# Patient Record
Sex: Female | Born: 1945 | Race: White | Hispanic: No | Marital: Married | Smoking: Never smoker
Health system: Southern US, Community
[De-identification: ages and names within clinical notes are randomized; demographics above are authoritative.]

## PROBLEM LIST (undated history)

## (undated) DIAGNOSIS — E785 Hyperlipidemia, unspecified: Secondary | ICD-10-CM

## (undated) DIAGNOSIS — I10 Essential (primary) hypertension: Secondary | ICD-10-CM

## (undated) DIAGNOSIS — I509 Heart failure, unspecified: Secondary | ICD-10-CM

## (undated) DIAGNOSIS — N189 Chronic kidney disease, unspecified: Secondary | ICD-10-CM

## (undated) DIAGNOSIS — E119 Type 2 diabetes mellitus without complications: Secondary | ICD-10-CM

## (undated) HISTORY — PX: CORONARY STENT PLACEMENT: SHX1402

## (undated) HISTORY — PX: OVARIAN CYST REMOVAL: SHX89

---

## 2012-07-04 ENCOUNTER — Encounter: Payer: Self-pay | Admitting: *Deleted

## 2012-07-04 ENCOUNTER — Emergency Department (INDEPENDENT_AMBULATORY_CARE_PROVIDER_SITE_OTHER)
Admission: EM | Admit: 2012-07-04 | Discharge: 2012-07-04 | Disposition: A | Discharge status: Home / Self Care | Payer: Medicare Other | Source: Home / Self Care | Attending: Family Medicine | Admitting: Family Medicine

## 2012-07-04 DIAGNOSIS — I509 Heart failure, unspecified: Secondary | ICD-10-CM

## 2012-07-04 DIAGNOSIS — J069 Acute upper respiratory infection, unspecified: Secondary | ICD-10-CM

## 2012-07-04 DIAGNOSIS — R300 Dysuria: Secondary | ICD-10-CM

## 2012-07-04 DIAGNOSIS — N3000 Acute cystitis without hematuria: Secondary | ICD-10-CM

## 2012-07-04 HISTORY — DX: Essential (primary) hypertension: I10

## 2012-07-04 HISTORY — DX: Type 2 diabetes mellitus without complications: E11.9

## 2012-07-04 HISTORY — DX: Hyperlipidemia, unspecified: E78.5

## 2012-07-04 HISTORY — DX: Heart failure, unspecified: I50.9

## 2012-07-04 LAB — POCT URINALYSIS DIP (MANUAL ENTRY)
Glucose, UA: NEGATIVE
Nitrite, UA: NEGATIVE
Protein Ur, POC: NEGATIVE
Urobilinogen, UA: 0.2 (ref 0–1)

## 2012-07-04 MED ORDER — CEPHALEXIN 500 MG PO CAPS: 500.0000 mg | ORAL_CAPSULE | Freq: Three times a day (TID) | ORAL | Status: DC

## 2012-07-04 MED ORDER — BENZONATATE 100 MG PO CAPS: ORAL_CAPSULE | ORAL | Status: DC

## 2012-07-04 NOTE — ED Provider Notes (Signed)
History     CSN: [id number]  Arrival date & time 07/04/12  1191   First MD Initiated Contact with Patient 07/04/12 [phone]      Chief Complaint  Patient presents with  . Dysuria  . Cough     HPI Comments: Patient complains of 2 day history of gradually progressive URI symptoms including sinus congestion and chest congestion.  She has had a mild cough.  Complains of fatigue but no myalgias.  Cough is generally non-productive during the day.  There has been no pleuritic pain but she occasionally wheezes and has shortness of breath.  She has a history of CHF, recently started on Coreg.  No PND. She also complains of 2 day history of dysuria, hesitancy, urgency but no back ache or fever.  She reports that she was treated for a UTI with Cipro about 3 weeks ago (with resolution of symptoms at that time).  The history is provided by the patient.    Past Medical History  Diagnosis Date  . Hypertension   . Diabetes mellitus without complication   . CHF (congestive heart failure)   . Hyperlipidemia     Past Surgical History  Procedure Date  . Coronary stent placement   . Ovarian cyst removal     Family History  Problem Relation Age of Onset  . Diabetes Sister   . Hyperlipidemia Sister   . Hypertension Sister   . COPD Brother     History  Substance Use Topics  . Smoking status: Never Smoker   . Smokeless tobacco: Not on file  . Alcohol Use: No    OB History    Grav Para Term Preterm Abortions TAB SAB Ect Mult Living                  Review of Systems No sore throat + cough No pleuritic pain + wheezing + nasal congestion + post-nasal drainage No sinus pain/pressure No itchy/red eyes No earache No hemoptysis + SOB with activity No fever/chills No nausea No vomiting No abdominal pain No diarrhea + dysuria/hesitancy/urgency No skin rashes + fatigue No myalgias + headache Used OTC meds without relief  Allergies  Sulfur  Home Medications   Current  Outpatient Rx  Name Route Sig Dispense Refill  . CARVEDILOL 25 MG PO TABS Oral Take 25 mg by mouth 2 (two) times daily with a meal.    . FERROUS SULFATE DRIED PO Oral Take by mouth.    . FUROSEMIDE 20 MG PO TABS Oral Take 20 mg by mouth 2 (two) times daily.    Marland Kitchen METFORMIN HCL 500 MG PO TABS Oral Take 500 mg by mouth 2 (two) times daily with a meal.    . NIACIN 100 MG PO TABS Oral Take 100 mg by mouth daily with breakfast.    . OMEPRAZOLE 40 MG PO CPDR Oral Take 40 mg by mouth daily.    Marland Kitchen PRAVASTATIN SODIUM 20 MG PO TABS Oral Take 20 mg by mouth daily.    Marland Kitchen BENZONATATE 100 MG PO CAPS  Take one cap at bedtime as necessary for cough 12 capsule 0  . CEPHALEXIN 500 MG PO CAPS Oral Take 1 capsule (500 mg total) by mouth 3 (three) times daily. 21 capsule 0    BP 148/86  Pulse 89  Temp 98.1 F (36.7 C) (Oral)  Resp 14  Ht 5\' 6"  (1.676 m)  Wt 183 lb (83.008 kg)  BMI 29.54 kg/m2  SpO2 96%  Physical Exam Nursing  notes and Vital Signs reviewed. Appearance:  Patient appears healthy, stated age, and in no acute distress Eyes:  Pupils are equal, round, and reactive to light and accomodation.  Extraocular movement is intact.  Conjunctivae are not inflamed  Ears:  Canals normal.  Tympanic membranes normal.  Nose:  Mildly congested turbinates.  No sinus tenderness.    Pharynx:  Normal Neck:  Supple.  Tender shotty posterior nodes are palpated, primarily on left Lungs:   Few faint scattered wheezes; no rales.  Breath sounds are equal.  Heart:  Regular rate and rhythm without murmurs, rubs, or gallops.  Abdomen:  Nontender without masses or hepatosplenomegaly.  Bowel sounds are present.  No CVA or flank tenderness.  Extremities:  No edema.  No calf tenderness Skin:  No rash present.   ED Course  Procedures  none   Labs Reviewed  POCT URINALYSIS DIP (MANUAL ENTRY) small leuks, otherwise negative  URINE CULTURE pending      1. Dysuria   2. Acute cystitis   3. Acute upper respiratory  infections of unspecified site; suspect early viral URI   4.  CHF appears stable    MDM  Urine culture pending.  Begin Keflex to cover UTI, and hopefully some additional respiratory coverage as her URI develops.  Patient increased risk with her CHF If she continues to have recurring cystitis, recommend she followup with PCP  Take plain Mucinex (guaifenesin) twice daily for cough and congestion.  Increase fluid intake, rest. May use Afrin nasal spray (or generic oxymetazoline) twice daily for about 5 days.  Also recommend using saline nasal spray several times daily and saline nasal irrigation (AYR is a common brand) Stop all antihistamines for now, and other non-prescription cough/cold preparations. Follow-up with family doctor if not improving 7 to 10 days.        Lattie Haw, MD 07/04/12 [phone]

## 2012-07-04 NOTE — ED Notes (Signed)
Patient c/o dysuria and urinary frequency x last night. Denies hematuria. Recent UTI 1 month ago. Also reports cough and congestion x 2 days. No fever. No OTC meds taken.

## 2012-07-06 ENCOUNTER — Telehealth: Payer: Self-pay

## 2012-07-06 LAB — URINE CULTURE: Colony Count: 10000

## 2012-07-06 NOTE — ED Notes (Signed)
Left message for patient to return call. Urine Culture

## 2012-10-15 ENCOUNTER — Telehealth: Payer: Self-pay | Admitting: *Deleted

## 2012-10-15 ENCOUNTER — Encounter: Payer: Self-pay | Admitting: *Deleted

## 2012-10-15 ENCOUNTER — Emergency Department (INDEPENDENT_AMBULATORY_CARE_PROVIDER_SITE_OTHER)
Admission: EM | Admit: 2012-10-15 | Discharge: 2012-10-15 | Disposition: A | Discharge status: Home / Self Care | Payer: Medicare Other | Source: Home / Self Care | Attending: Family Medicine | Admitting: Family Medicine

## 2012-10-15 DIAGNOSIS — N3000 Acute cystitis without hematuria: Secondary | ICD-10-CM

## 2012-10-15 DIAGNOSIS — R300 Dysuria: Secondary | ICD-10-CM

## 2012-10-15 LAB — POCT URINALYSIS DIP (MANUAL ENTRY)
Bilirubin, UA: NEGATIVE
Blood, UA: NEGATIVE
Ketones, POC UA: NEGATIVE
Spec Grav, UA: 1.015 (ref 1.005–1.03)
pH, UA: 6 (ref 5–8)

## 2012-10-15 MED ORDER — CIPROFLOXACIN HCL 250 MG PO TABS: 250.0000 mg | ORAL_TABLET | Freq: Two times a day (BID) | ORAL | Status: DC

## 2012-10-15 NOTE — ED Notes (Signed)
Patient c/o dysuria x yesterday. Denies fever or chills.

## 2012-10-15 NOTE — ED Provider Notes (Addendum)
History     CSN: [id number]  Arrival date & time 10/15/12  1447   First MD Initiated Contact with Patient 10/15/12 1517      Chief Complaint  Patient presents with  . Dysuria       HPI Comments: Patient complains of developing lower abdominal pressure, dysuria, and hesitancy last night.  Patient is a 67 y.o. female presenting with dysuria. The history is provided by the patient.  Dysuria  This is a new problem. The current episode started yesterday. The problem occurs every urination. Associated symptoms include frequency, hesitancy and urgency. Pertinent negatives include no chills, no sweats, no nausea, no vomiting, no discharge, no hematuria and no flank pain. She has tried nothing for the symptoms.    Past Medical History  Diagnosis Date  . Hypertension   . Diabetes mellitus without complication   . CHF (congestive heart failure)   . Hyperlipidemia     Past Surgical History  Procedure Laterality Date  . Coronary stent placement    . Ovarian cyst removal      Family History  Problem Relation Age of Onset  . Diabetes Sister   . Hyperlipidemia Sister   . Hypertension Sister   . COPD Brother     History  Substance Use Topics  . Smoking status: Never Smoker   . Smokeless tobacco: Not on file  . Alcohol Use: No    OB History   Grav Para Term Preterm Abortions TAB SAB Ect Mult Living                  Review of Systems  Constitutional: Negative for chills.  Gastrointestinal: Negative for nausea and vomiting.  Genitourinary: Positive for dysuria, hesitancy, urgency and frequency. Negative for hematuria and flank pain.  All other systems reviewed and are negative.    Allergies  Latex and Sulfur  Home Medications   Current Outpatient Rx  Name  Route  Sig  Dispense  Refill  . carvedilol (COREG) 25 MG tablet   Oral   Take 25 mg by mouth 2 (two) times daily with a meal.         . ciprofloxacin (CIPRO) 250 MG tablet   Oral   Take 1 tablet (250 mg  total) by mouth 2 (two) times daily.   14 tablet   0   . FERROUS SULFATE DRIED PO   Oral   Take by mouth.         . furosemide (LASIX) 20 MG tablet   Oral   Take 20 mg by mouth 2 (two) times daily.         . metFORMIN (GLUCOPHAGE) 500 MG tablet   Oral   Take 500 mg by mouth 2 (two) times daily with a meal.         . niacin 100 MG tablet   Oral   Take 100 mg by mouth daily with breakfast.         . omeprazole (PRILOSEC) 40 MG capsule   Oral   Take 40 mg by mouth daily.         . pravastatin (PRAVACHOL) 20 MG tablet   Oral   Take 20 mg by mouth daily.           BP 105/68  Pulse 96  Temp(Src) 98.2 F (36.8 C) (Oral)  Ht 5\' 6"  (1.676 m)  Wt 180 lb (81.647 kg)  BMI 29.07 kg/m2  SpO2 97%  Physical Exam Nursing notes and Vital Signs  reviewed. Appearance:  Patient appears healthy, stated age, and in no acute distress Eyes:  Pupils are equal, round, and reactive to light and accomodation.  Extraocular movement is intact.  Conjunctivae are not inflamed  Pharynx:  Normal Neck:  Supple.  No adenopathy Lungs:  Clear to auscultation.  Breath sounds are equal.  Heart:  Regular rate and rhythm without murmurs, rubs, or gallops.  Abdomen:  Nontender without masses or hepatosplenomegaly.  Bowel sounds are present.  No CVA or flank tenderness.  Extremities:  No edema.  No calf tenderness Skin:  No rash present.   ED Course  Procedures  none  Labs Reviewed  URINE CULTURE pending  POCT URINALYSIS DIP (MANUAL ENTRY) NIT positive; LEU small, otherwise negative      1. Dysuria   2. Acute cystitis       MDM   Urine culture pending Begin Cipro. Continue increased fluid intake.  May take AZO for about two days if desired. Followup with Family Doctor if not improved in one week.         Lattie Haw, MD 10/19/12 [phone]  Addendum:   Followup call to patient:  She reports that her urinary symptoms have completely resolved.  Lattie Haw,  MD 10/19/12 [phone]

## 2012-10-17 LAB — URINE CULTURE

## 2012-10-19 ENCOUNTER — Telehealth: Payer: Self-pay | Admitting: *Deleted

## 2014-04-11 ENCOUNTER — Emergency Department
Admission: EM | Admit: 2014-04-11 | Discharge: 2014-04-11 | Disposition: A | Discharge status: Home / Self Care | Payer: 59 | Source: Home / Self Care | Attending: Family Medicine | Admitting: Family Medicine

## 2014-04-11 ENCOUNTER — Encounter: Payer: Self-pay | Admitting: Emergency Medicine

## 2014-04-11 DIAGNOSIS — N3000 Acute cystitis without hematuria: Secondary | ICD-10-CM

## 2014-04-11 LAB — POCT URINALYSIS DIP (MANUAL ENTRY)
BILIRUBIN UA: NEGATIVE
Bilirubin, UA: NEGATIVE
Glucose, UA: NEGATIVE
Nitrite, UA: POSITIVE
PROTEIN UA: NEGATIVE
SPEC GRAV UA: 1.015 (ref 1.005–1.03)
UROBILINOGEN UA: 0.2 (ref 0–1)
pH, UA: 6 (ref 5–8)

## 2014-04-11 MED ORDER — CEPHALEXIN 500 MG PO CAPS: 500.0000 mg | ORAL_CAPSULE | Freq: Two times a day (BID) | ORAL | Status: DC

## 2014-04-11 NOTE — ED Notes (Signed)
Reports 2 days of urinary frequency with some lower back discomfort.

## 2014-04-11 NOTE — ED Provider Notes (Signed)
CSN: [id number]     Arrival date & time 04/11/14  1612 History   First MD Initiated Contact with Patient 04/11/14 1700     Chief Complaint  Patient presents with  . Urinary Frequency  . Back Pain      HPI Comments: Patient developed urinary frequency without dysuria one week ago.  Yesterday she developed chills and low back ache.  She has also been fatigued.  She denies any recent antibiotic use.  Patient is a 68 y.o. female presenting with frequency. The history is provided by the patient.  Urinary Frequency This is a new problem. Episode onset: 1 week ago. The problem occurs constantly. The problem has been gradually worsening. Pertinent negatives include no abdominal pain. Nothing aggravates the symptoms. Nothing relieves the symptoms. She has tried nothing for the symptoms.    Past Medical History  Diagnosis Date  . Hypertension   . Diabetes mellitus without complication   . CHF (congestive heart failure)   . Hyperlipidemia    Past Surgical History  Procedure Laterality Date  . Coronary stent placement    . Ovarian cyst removal     Family History  Problem Relation Age of Onset  . Diabetes Sister   . Hyperlipidemia Sister   . Hypertension Sister   . COPD Brother    History  Substance Use Topics  . Smoking status: Never Smoker   . Smokeless tobacco: Not on file  . Alcohol Use: No   OB History   Grav Para Term Preterm Abortions TAB SAB Ect Mult Living                 Review of Systems  Gastrointestinal: Negative for abdominal pain.  Genitourinary: Positive for frequency.  All other systems reviewed and are negative.   Allergies  Latex and Sulfur  Home Medications   Prior to Admission medications   Medication Sig Start Date End Date Taking? Authorizing Provider  carvedilol (COREG) 25 MG tablet Take 25 mg by mouth 2 (two) times daily with a meal.    Historical Provider, MD  cephALEXin (KEFLEX) 500 MG capsule Take 1 capsule (500 mg total) by mouth 2 (two) times  daily. 04/11/14   Lattie HawStephen A Beese, MD  ciprofloxacin (CIPRO) 250 MG tablet Take 1 tablet (250 mg total) by mouth 2 (two) times daily. 10/15/12   Lattie HawStephen A Beese, MD  FERROUS SULFATE DRIED PO Take by mouth.    Historical Provider, MD  furosemide (LASIX) 20 MG tablet Take 20 mg by mouth 2 (two) times daily.    Historical Provider, MD  metFORMIN (GLUCOPHAGE) 500 MG tablet Take 500 mg by mouth 2 (two) times daily with a meal.    Historical Provider, MD  niacin 100 MG tablet Take 100 mg by mouth daily with breakfast.    Historical Provider, MD  omeprazole (PRILOSEC) 40 MG capsule Take 40 mg by mouth daily.    Historical Provider, MD  pravastatin (PRAVACHOL) 20 MG tablet Take 20 mg by mouth daily.    Historical Provider, MD   BP 111/72  Pulse 89  Temp(Src) 98.4 F (36.9 C) (Oral)  Ht 5\' 6"  (1.676 m)  Wt 169 lb (76.658 kg)  BMI 27.29 kg/m2  SpO2 97% Physical Exam Nursing notes and Vital Signs reviewed. Appearance:  Patient appears healthy, stated age, and in no acute distress Eyes:  Pupils are equal, round, and reactive to light and accomodation.  Extraocular movement is intact.  Conjunctivae are not inflamed  Pharynx:  Normal;  moist mucous membranes  Neck:  Supple.   No adenopathy Lungs:  Clear to auscultation.  Breath sounds are equal.  Heart:  Regular rate and rhythm without murmurs, rubs, or gallops.  Abdomen:  Nontender without masses or hepatosplenomegaly.  Bowel sounds are present.  No CVA or flank tenderness.  Extremities:  No edema.  No calf tenderness Skin:  No rash present.   ED Course  Procedures  none    Labs Reviewed  URINE CULTURE  POCT URINALYSIS DIP (MANUAL ENTRY):  BLO trace intact; NIT positive; LEU moderate         MDM   1. Acute cystitis without hematuria    Urine culture pending.  Will empirically begin Keflex based on previous culture done 10/15/12. Increase fluid intake. If symptoms become significantly worse during the night or over the weekend, proceed to  the local emergency room.  Followup with Family Doctor if not improved in one week.     Lattie Haw, MD 04/15/14 [phone]

## 2014-04-11 NOTE — Discharge Instructions (Signed)
Increase fluid intake. °If symptoms become significantly worse during the night or over the weekend, proceed to the local emergency room.  ° ° °Urinary Tract Infection °Urinary tract infections (UTIs) can develop anywhere along your urinary tract. Your urinary tract is your body's drainage system for removing wastes and extra water. Your urinary tract includes two kidneys, two ureters, a bladder, and a urethra. Your kidneys are a pair of bean-shaped organs. Each kidney is about the size of your fist. They are located below your ribs, one on each side of your spine. °CAUSES °Infections are caused by microbes, which are microscopic organisms, including fungi, viruses, and bacteria. These organisms are so small that they can only be seen through a microscope. Bacteria are the microbes that most commonly cause UTIs. °SYMPTOMS  °Symptoms of UTIs may vary by age and gender of the patient and by the location of the infection. Symptoms in young women typically include a frequent and intense urge to urinate and a painful, burning feeling in the bladder or urethra during urination. Older women and men are more likely to be tired, shaky, and weak and have muscle aches and abdominal pain. A fever may mean the infection is in your kidneys. Other symptoms of a kidney infection include pain in your back or sides below the ribs, nausea, and vomiting. °DIAGNOSIS °To diagnose a UTI, your caregiver will ask you about your symptoms. Your caregiver also will ask to provide a urine sample. The urine sample will be tested for bacteria and white blood cells. White blood cells are made by your body to help fight infection. °TREATMENT  °Typically, UTIs can be treated with medication. Because most UTIs are caused by a bacterial infection, they usually can be treated with the use of antibiotics. The choice of antibiotic and length of treatment depend on your symptoms and the type of bacteria causing your infection. °HOME CARE  INSTRUCTIONS °· If you were prescribed antibiotics, take them exactly as your caregiver instructs you. Finish the medication even if you feel better after you have only taken some of the medication. °· Drink enough water and fluids to keep your urine clear or pale yellow. °· Avoid caffeine, tea, and carbonated beverages. They tend to irritate your bladder. °· Empty your bladder often. Avoid holding urine for long periods of time. °· Empty your bladder before and after sexual intercourse. °· After a bowel movement, women should cleanse from front to back. Use each tissue only once. °SEEK MEDICAL CARE IF:  °· You have back pain. °· You develop a fever. °· Your symptoms do not begin to resolve within 3 days. °SEEK IMMEDIATE MEDICAL CARE IF:  °· You have severe back pain or lower abdominal pain. °· You develop chills. °· You have nausea or vomiting. °· You have continued burning or discomfort with urination. °MAKE SURE YOU:  °· Understand these instructions. °· Will watch your condition. °· Will get help right away if you are not doing well or get worse. °Document Released: 06/01/2005 Document Revised: 02/21/2012 Document Reviewed: 09/30/2011 °[employer]® Patient Information ©2015 [employer]. This information is not intended to replace advice given to you by your health care provider. Make sure you discuss any questions you have with your health care provider. ° °

## 2014-04-14 LAB — URINE CULTURE

## 2014-11-25 ENCOUNTER — Emergency Department (INDEPENDENT_AMBULATORY_CARE_PROVIDER_SITE_OTHER): Payer: 59

## 2014-11-25 ENCOUNTER — Encounter: Payer: Self-pay | Admitting: *Deleted

## 2014-11-25 ENCOUNTER — Emergency Department
Admission: EM | Admit: 2014-11-25 | Discharge: 2014-11-25 | Disposition: A | Discharge status: Home / Self Care | Payer: 59 | Source: Home / Self Care | Attending: Emergency Medicine | Admitting: Emergency Medicine

## 2014-11-25 DIAGNOSIS — R0602 Shortness of breath: Secondary | ICD-10-CM

## 2014-11-25 DIAGNOSIS — Z8679 Personal history of other diseases of the circulatory system: Secondary | ICD-10-CM

## 2014-11-25 DIAGNOSIS — R058 Other specified cough: Secondary | ICD-10-CM

## 2014-11-25 DIAGNOSIS — R05 Cough: Secondary | ICD-10-CM

## 2014-11-25 DIAGNOSIS — J209 Acute bronchitis, unspecified: Secondary | ICD-10-CM

## 2014-11-25 MED ORDER — AZITHROMYCIN 250 MG PO TABS: ORAL_TABLET | ORAL | Status: DC

## 2014-11-25 MED ORDER — METHYLPREDNISOLONE ACETATE 80 MG/ML IJ SUSP
80.0000 mg | Freq: Once | INTRAMUSCULAR | Status: AC
  Administered 2014-11-25: 80 mg via INTRAMUSCULAR

## 2014-11-25 MED ORDER — METHYLPREDNISOLONE 4 MG PO KIT: PACK | ORAL | Status: DC

## 2014-11-25 MED ORDER — CEFTRIAXONE SODIUM 1 G IJ SOLR
1.0000 g | INTRAMUSCULAR | Status: AC
  Administered 2014-11-25: 1 g via INTRAMUSCULAR

## 2014-11-25 MED ORDER — GUAIFENESIN ER 600 MG PO TB12: 600.0000 mg | ORAL_TABLET | Freq: Two times a day (BID) | ORAL | Status: DC

## 2014-11-25 MED ORDER — IPRATROPIUM-ALBUTEROL 0.5-2.5 (3) MG/3ML IN SOLN
3.0000 mL | Freq: Once | RESPIRATORY_TRACT | Status: AC
  Administered 2014-11-25: 3 mL via RESPIRATORY_TRACT

## 2014-11-25 NOTE — ED Provider Notes (Signed)
CSN: [id number]     Arrival date & time 11/25/14  1317 History   First MD Initiated Contact with Patient 11/25/14 1318     Chief Complaint  Patient presents with  . URI   (Consider location/radiation/quality/duration/timing/severity/associated sxs/prior Treatment) HPI URI HISTORY  April Delgado is a 69 y.o. female who complains of 3 days of progressively worsening cough occasionally productive of whitish yellow sputum, severe progressive dyspnea. With fatigue. No syncope or focal neurologic symptoms. Denies chest pain.  Has not tried any  over-the-counter treatment. She's never smoked cigarettes. She states she has never had a diagnosis of asthma or COPD, but after further questioning, she stated that she once tried her sister's albuterol nebulizer last year when she had some wheezing, and that helped somewhat. She has a history of CHF, and states she has baseline dyspnea on exertion, but that's much worse the past 3 days.  She feels this might have started 3 days ago with flulike illness with some fever and chills, but the fever and chills have resolved. Associated symptoms: Has mild left ear pain. Some nausea, can vomit only after coughing spell. Tolerating by mouth's.  No chills/sweats now No fever the past 2 days  Minimal Nasal congestion No Discolored Post-nasal drainage No sinus pain/pressure No sore throat  +  cough No wheezing Positive chest congestion No hemoptysis Positive shortness of breath No pleuritic pain  No itchy/red eyes Left earache  No abdominal pain No diarrhea  No skin rashes +  Fatigue No myalgias No headache   Past Medical History  Diagnosis Date  . Hypertension   . Diabetes mellitus without complication   . CHF (congestive heart failure)   . Hyperlipidemia    Past Surgical History  Procedure Laterality Date  . Coronary stent placement    . Ovarian cyst removal     Family History  Problem Relation Age of Onset  . Diabetes Sister   .  Hyperlipidemia Sister   . Hypertension Sister   . COPD Brother    History  Substance Use Topics  . Smoking status: Never Smoker   . Smokeless tobacco: Never Used  . Alcohol Use: No   OB History    No data available     Review of Systems  All other systems reviewed and are negative.   Allergies  Latex and Sulfur  Home Medications   Prior to Admission medications   Medication Sig Start Date End Date Taking? Authorizing Provider  aspirin 81 MG tablet Take 81 mg by mouth daily.   Yes Historical Provider, MD  carvedilol (COREG) 25 MG tablet Take 25 mg by mouth 2 (two) times daily with a meal.   Yes Historical Provider, MD  FERROUS SULFATE DRIED PO Take by mouth.   Yes Historical Provider, MD  furosemide (LASIX) 20 MG tablet Take 20 mg by mouth 2 (two) times daily.   Yes Historical Provider, MD  isosorbide mononitrate (IMDUR) 30 MG 24 hr tablet Take 30 mg by mouth daily.   Yes Historical Provider, MD  metFORMIN (GLUCOPHAGE) 500 MG tablet Take 500 mg by mouth 2 (two) times daily with a meal.   Yes Historical Provider, MD  niacin 100 MG tablet Take 100 mg by mouth daily with breakfast.   Yes Historical Provider, MD  omeprazole (PRILOSEC) 40 MG capsule Take 40 mg by mouth daily.   Yes Historical Provider, MD  pravastatin (PRAVACHOL) 20 MG tablet Take 20 mg by mouth daily.   Yes Historical Provider, MD  azithromycin (  ZITHROMAX Z-PAK) 250 MG tablet Take 2 tablets on day one, then 1 tablet daily on days 2 through 5.-Start taking today, Tuesday 3/22 11/25/14   Lajean Manesavid Massey, MD  guaiFENesin (MUCINEX) 600 MG 12 hr tablet Take 1 tablet (600 mg total) by mouth 2 (two) times daily. To help loosen phlegm. Take with a glass of water 11/25/14   Lajean Manesavid Massey, MD  methylPREDNISolone (MEDROL DOSEPAK) 4 MG tablet Start taking tomorrow, Wednesday 3/23. -Take as directed for 6 days 11/25/14   Lajean Manesavid Massey, MD   BP 111/65 mmHg  Pulse 95  Temp(Src) 97.7 F (36.5 C) (Oral)  Resp 18  Wt 175 lb (79.379 kg)   SpO2 95% Physical Exam  Constitutional: She is oriented to person, place, and time. She appears well-developed and well-nourished. She appears distressed (mildly tachypnea And dyspneic at rest).  Alert, cooperative female, mildly dyspneic  HENT:  Head: Normocephalic and atraumatic.  Right Ear: Tympanic membrane normal.  Left Ear: Tympanic membrane normal.  Nose: Nose normal.  Mouth/Throat: Oropharynx is clear and moist. No oropharyngeal exudate.  Eyes: Conjunctivae are normal. Pupils are equal, round, and reactive to light. Right eye exhibits no discharge. Left eye exhibits no discharge. No scleral icterus.  Neck: Neck supple. No JVD present. No thyromegaly present.  Cardiovascular: Normal rate, regular rhythm and normal heart sounds.   No murmur heard. Pulmonary/Chest: No accessory muscle usage. She is in respiratory distress (Very mild. Pulse ox 95% room air). She has no decreased breath sounds. She has wheezes. She has rhonchi. She has rales (bibasilar).  Abdominal: She exhibits no distension.  Musculoskeletal: She exhibits no edema or tenderness.  Lymphadenopathy:    She has no cervical adenopathy.  Neurological: She is alert and oriented to person, place, and time. No cranial nerve deficit.  Skin: Skin is warm and dry. No rash noted.  Psychiatric: She has a normal mood and affect.  Nursing note and vitals reviewed.   ED Course  Procedures (including critical care time) Labs Review Labs Reviewed - No data to display 1:55 PM: Chest x-ray and DuoNeb ordered.--Wheezing improved after DuoNeb. Imaging Review Dg Chest 2 View  11/25/2014   CLINICAL DATA:  Cough, shortness of breath  EXAM: CHEST  2 VIEW  COMPARISON:  None.  FINDINGS: Chronic interstitial markings/emphysematous changes. No focal consolidation. No pleural effusion or pneumothorax.  The heart is normal in size.  Surgical clips in the left chest wall/axilla.  IMPRESSION: No evidence of acute cardiopulmonary disease.    Electronically Signed   By: Charline BillsSriyesh  Krishnan M.D.   On: 11/25/2014 14:45     MDM   1. Acute bronchitis with bronchospasm   2. Productive cough   3. Shortness of breath   4. History of CHF (congestive heart failure)     Treatment options discussed, as well as risks, benefits, alternatives. Patient voiced understanding and agreement with the following plans: DuoNeb nebulizer treatment given. Wheezing improved.--Pulse ox improved to  96% on room air. Vital signs stable. Chest x-ray shows no acute abnormality. There are some chronic interstitial markings and emphysematous changes. No evidence of CHF or pneumonia. Start with Rocephin 1 g IM stat and Depo-Medrol 80 mg IM stat New Prescriptions   AZITHROMYCIN (ZITHROMAX Z-PAK) 250 MG TABLET    Take 2 tablets on day one, then 1 tablet daily on days 2 through 5.-Start taking today, Tuesday 3/22   GUAIFENESIN (MUCINEX) 600 MG 12 HR TABLET    Take 1 tablet (600 mg total) by mouth 2 (  two) times daily. To help loosen phlegm. Take with a glass of water   METHYLPREDNISOLONE (MEDROL DOSEPAK) 4 MG TABLET    Start taking tomorrow, Wednesday 3/23. -Take as directed for 6 days  Follow-up with your primary care doctor in 3 days , or sooner if symptoms become worse. Precautions discussed. Red flags discussed.--Go to emergency room if any severe or worsening symptoms or red flags. Questions invited and answered. Patient voiced understanding and agreement.    Lajean Manes, MD 11/25/14 [phone]

## 2014-11-25 NOTE — ED Notes (Signed)
Sherre c/o cough, congestion, SOB and fatigue x 3 days. Afebrile.

## 2015-07-29 ENCOUNTER — Encounter: Payer: Self-pay | Admitting: *Deleted

## 2015-07-29 ENCOUNTER — Emergency Department
Admission: EM | Admit: 2015-07-29 | Discharge: 2015-07-29 | Disposition: A | Discharge status: Home / Self Care | Payer: 59 | Source: Home / Self Care | Attending: Family Medicine | Admitting: Family Medicine

## 2015-07-29 DIAGNOSIS — J069 Acute upper respiratory infection, unspecified: Secondary | ICD-10-CM

## 2015-07-29 MED ORDER — BENZONATATE 200 MG PO CAPS: 200.0000 mg | ORAL_CAPSULE | Freq: Every day | ORAL | Status: DC

## 2015-07-29 MED ORDER — DOXYCYCLINE HYCLATE 100 MG PO CAPS: 100.0000 mg | ORAL_CAPSULE | Freq: Two times a day (BID) | ORAL | Status: DC

## 2015-07-29 NOTE — ED Provider Notes (Signed)
CSN: [id number]     Arrival date & time 07/29/15  1517 History   First MD Initiated Contact with Patient 07/29/15 1528     Chief Complaint  Patient presents with  . Shortness of Breath      HPI Comments: Patient has a history of CHF and is concerned that she may be developing bronchitis.  About 3 days ago she developed sinus congestion. Yesterday she developed increased shortness of breath with activity and increased fatigue.  She has also developed a slight cough and sensation of tightness in her anterior chest with inspiration.  She denies swelling in her legs.  No fevers, chills, and sweats. She has a past history of pneumonia in 2004.  The history is provided by the patient.    Past Medical History  Diagnosis Date  . Hypertension   . Diabetes mellitus without complication (HCC)   . CHF (congestive heart failure) (HCC)   . Hyperlipidemia    Past Surgical History  Procedure Laterality Date  . Coronary stent placement    . Ovarian cyst removal     Family History  Problem Relation Age of Onset  . Diabetes Sister   . Hyperlipidemia Sister   . Hypertension Sister   . COPD Brother    Social History  Substance Use Topics  . Smoking status: Never Smoker   . Smokeless tobacco: Never Used  . Alcohol Use: No   OB History    No data available     Review of Systems ? sore throat + minimal cough No pleuritic pain but has tightness in anterior chest  No wheezing + nasal congestion + post-nasal drainage No sinus pain/pressure No itchy/red eyes No earache No hemoptysis + SOB with activity No fever, + chills No nausea No vomiting No abdominal pain No diarrhea No urinary symptoms No skin rash + fatigue No myalgias + headache Used OTC meds without relief  Allergies  Latex and Sulfur  Home Medications   Prior to Admission medications   Medication Sig Start Date End Date Taking? Authorizing Provider  aspirin 81 MG tablet Take 81 mg by mouth daily.    Historical  Provider, MD  benzonatate (TESSALON) 200 MG capsule Take 1 capsule (200 mg total) by mouth at bedtime. Take as needed for cough 07/29/15   Lattie HawStephen A Dimetri Armitage, MD  carvedilol (COREG) 25 MG tablet Take 25 mg by mouth 2 (two) times daily with a meal.    Historical Provider, MD  doxycycline (VIBRAMYCIN) 100 MG capsule Take 1 capsule (100 mg total) by mouth 2 (two) times daily. Take with food. 07/29/15   Lattie HawStephen A Caydee Talkington, MD  FERROUS SULFATE DRIED PO Take by mouth.    Historical Provider, MD  furosemide (LASIX) 20 MG tablet Take 20 mg by mouth 2 (two) times daily.    Historical Provider, MD  isosorbide mononitrate (IMDUR) 30 MG 24 hr tablet Take 30 mg by mouth daily.    Historical Provider, MD  metFORMIN (GLUCOPHAGE) 500 MG tablet Take 500 mg by mouth 2 (two) times daily with a meal.    Historical Provider, MD  niacin 100 MG tablet Take 100 mg by mouth daily with breakfast.    Historical Provider, MD  omeprazole (PRILOSEC) 40 MG capsule Take 40 mg by mouth daily.    Historical Provider, MD  pravastatin (PRAVACHOL) 20 MG tablet Take 20 mg by mouth daily.    Historical Provider, MD   Meds Ordered and Administered this Visit  Medications - No data to  display  BP 143/83 mmHg  Pulse 76  Temp(Src) 98.1 F (36.7 C) (Oral)  Resp 24  Ht  (1.676 m)  Wt 168 lb (76.204 kg)  BMI 27.13 kg/m2  SpO2 98% No data found.   Physical Exam Nursing notes and Vital Signs reviewed. Appearance:  Patient appears stated age, and in no acute distress Eyes:  Pupils are equal, round, and reactive to light and accomodation.  Extraocular movement is intact.  Conjunctivae are not inflamed  Ears:  Canals normal.  Tympanic membranes normal.  Nose:  Mildly congested turbinates.  No sinus tenderness.   Pharynx:  Normal Neck:  Supple.   Tender enlarged posterior nodes are palpated bilaterally  Lungs:  Clear to auscultation.  Breath sounds are equal.  Moving air well. Heart:  Regular rate and rhythm without murmurs, rubs,  or gallops.  Abdomen:  Nontender without masses or hepatosplenomegaly.  Bowel sounds are present.  No CVA or flank tenderness.  Extremities:  No edema.  No calf tenderness Skin:  No rash present.   ED Course  Procedures  none  MDM   1. Viral URI    There is no evidence of bacterial infection today.  However, with her history of CHF and past pneumonia, will begin doxycycline  BID Prescription written for Benzonatate (Tessalon) to take at bedtime for night-time cough.  Take plain guaifenesin (  extended release tabs such as Mucinex) twice daily, with plenty of water, for cough and congestion.  Get adequate rest.   May use Afrin nasal spray (or generic oxymetazoline) twice daily for about 5 days and then discontinue.  Also recommend using saline nasal spray several times daily and saline nasal irrigation (AYR is a common brand).  Try warm salt water gargles for sore throat.  Stop all antihistamines for now, and other non-prescription cough/cold preparations.   Follow-up with family doctor if not improving about 7 to10 days.     Lattie Haw, MD 07/29/15 [phone]

## 2015-07-29 NOTE — ED Notes (Signed)
Started yesterday with chest tightness.  Had bronchitis 2 months ago.  Becomes SOB with exertion.  O2 sat 98%.

## 2015-07-29 NOTE — Discharge Instructions (Signed)
Take plain guaifenesin (1200mg  extended release tabs such as Mucinex) twice daily, with plenty of water, for cough and congestion.  Get adequate rest.   May use Afrin nasal spray (or generic oxymetazoline) twice daily for about 5 days and then discontinue.  Also recommend using saline nasal spray several times daily and saline nasal irrigation (AYR is a common brand).  Try warm salt water gargles for sore throat.  Stop all antihistamines for now, and other non-prescription cough/cold preparations.   Follow-up with family doctor if not improving about 7 to10 days.

## 2015-11-07 ENCOUNTER — Emergency Department
Admission: EM | Admit: 2015-11-07 | Discharge: 2015-11-07 | Disposition: A | Discharge status: Home / Self Care | Payer: Medicare HMO | Source: Home / Self Care | Attending: Family Medicine | Admitting: Family Medicine

## 2015-11-07 ENCOUNTER — Encounter: Payer: Self-pay | Admitting: Emergency Medicine

## 2015-11-07 DIAGNOSIS — N3090 Cystitis, unspecified without hematuria: Secondary | ICD-10-CM | POA: Diagnosis not present

## 2015-11-07 HISTORY — DX: Chronic kidney disease, unspecified: N18.9

## 2015-11-07 MED ORDER — NITROFURANTOIN MONOHYD MACRO 100 MG PO CAPS: 100.0000 mg | ORAL_CAPSULE | Freq: Two times a day (BID) | ORAL | Status: DC

## 2015-11-07 NOTE — Discharge Instructions (Signed)
Increase fluid intake. °May use non-prescription AZO for about two days, if desired, to decrease urinary discomfort.  °If symptoms become significantly worse during the night or over the weekend, proceed to the local emergency room.  °Followup with Family Doctor if not improved in one week.  ° ° °Urinary Tract Infection °Urinary tract infections (UTIs) can develop anywhere along your urinary tract. Your urinary tract is your body's drainage system for removing wastes and extra water. Your urinary tract includes two kidneys, two ureters, a bladder, and a urethra. Your kidneys are a pair of bean-shaped organs. Each kidney is about the size of your fist. They are located below your ribs, one on each side of your spine. °CAUSES °Infections are caused by microbes, which are microscopic organisms, including fungi, viruses, and bacteria. These organisms are so small that they can only be seen through a microscope. Bacteria are the microbes that most commonly cause UTIs. °SYMPTOMS  °Symptoms of UTIs may vary by age and gender of the patient and by the location of the infection. Symptoms in young women typically include a frequent and intense urge to urinate and a painful, burning feeling in the bladder or urethra during urination. Older women and men are more likely to be tired, shaky, and weak and have muscle aches and abdominal pain. A fever may mean the infection is in your kidneys. Other symptoms of a kidney infection include pain in your back or sides below the ribs, nausea, and vomiting. °DIAGNOSIS °To diagnose a UTI, your caregiver will ask you about your symptoms. Your caregiver will also ask you to provide a urine sample. The urine sample will be tested for bacteria and white blood cells. White blood cells are made by your body to help fight infection. °TREATMENT  °Typically, UTIs can be treated with medication. Because most UTIs are caused by a bacterial infection, they usually can be treated with the use of  antibiotics. The choice of antibiotic and length of treatment depend on your symptoms and the type of bacteria causing your infection. °HOME CARE INSTRUCTIONS °· If you were prescribed antibiotics, take them exactly as your caregiver instructs you. Finish the medication even if you feel better after you have only taken some of the medication. °· Drink enough water and fluids to keep your urine clear or pale yellow. °· Avoid caffeine, tea, and carbonated beverages. They tend to irritate your bladder. °· Empty your bladder often. Avoid holding urine for long periods of time. °· Empty your bladder before and after sexual intercourse. °· After a bowel movement, women should cleanse from front to back. Use each tissue only once. °SEEK MEDICAL CARE IF:  °· You have back pain. °· You develop a fever. °· Your symptoms do not begin to resolve within 3 days. °SEEK IMMEDIATE MEDICAL CARE IF:  °· You have severe back pain or lower abdominal pain. °· You develop chills. °· You have nausea or vomiting. °· You have continued burning or discomfort with urination. °MAKE SURE YOU:  °· Understand these instructions. °· Will watch your condition. °· Will get help right away if you are not doing well or get worse. °  °This information is not intended to replace advice given to you by your health care provider. Make sure you discuss any questions you have with your health care provider. °  °Document Released: 06/01/2005 Document Revised: 05/13/2015 Document Reviewed: 09/30/2011 °Elsevier Interactive Patient Education ©2016 [employer] ° °

## 2015-11-07 NOTE — ED Provider Notes (Signed)
CSN: [id number]     Arrival date & time 11/07/15  1246 History   First MD Initiated Contact with Patient 11/07/15 1339     Chief Complaint  Patient presents with  . Dysuria  . Urinary Frequency  . Chills      HPI Comments: Patient complains of two day history of dysuria, urgency, and nocturia.  She has had chills.  She has taken AZO for pain  Patient is a 70 y.o. female presenting with dysuria. The history is provided by the patient.  Dysuria Pain quality:  Burning Pain severity:  Mild Onset quality:  Gradual Duration:  2 days Timing:  Constant Progression:  Worsening Chronicity:  New Recent urinary tract infections: no   Relieved by:  Phenazopyridine Worsened by:  Nothing tried Ineffective treatments:  None tried Urinary symptoms: frequent urination and hesitancy   Urinary symptoms: no discolored urine, no foul-smelling urine, no hematuria and no bladder incontinence   Associated symptoms: no abdominal pain, no fever, no flank pain, no nausea and no vaginal discharge     Past Medical History  Diagnosis Date  . Hypertension   . Diabetes mellitus without complication (HCC)   . CHF (congestive heart failure) (HCC)   . Hyperlipidemia   . Chronic kidney disease     stage 2   Past Surgical History  Procedure Laterality Date  . Coronary stent placement    . Ovarian cyst removal     Family History  Problem Relation Age of Onset  . Diabetes Sister   . Hyperlipidemia Sister   . Hypertension Sister   . COPD Brother    Social History  Substance Use Topics  . Smoking status: Never Smoker   . Smokeless tobacco: Never Used  . Alcohol Use: No   OB History    No data available     Review of Systems  Constitutional: Negative for fever.  Gastrointestinal: Negative for nausea and abdominal pain.  Genitourinary: Positive for dysuria. Negative for flank pain and vaginal discharge.  All other systems reviewed and are negative.   Allergies  Latex and Sulfur  Home  Medications   Prior to Admission medications   Medication Sig Start Date End Date Taking? Authorizing Provider  spironolactone (ALDACTONE) 25 MG tablet Take 25 mg by mouth daily.   Yes Historical Provider, MD  aspirin 81 MG tablet Take 81 mg by mouth daily.    Historical Provider, MD  benzonatate (TESSALON) 200 MG capsule Take 1 capsule (200 mg total) by mouth at bedtime. Take as needed for cough 07/29/15   Lattie HawStephen A Rosely Fernandez, MD  carvedilol (COREG) 25 MG tablet Take 25 mg by mouth 2 (two) times daily with a meal.    Historical Provider, MD  FERROUS SULFATE DRIED PO Take by mouth.    Historical Provider, MD  furosemide (LASIX) 20 MG tablet Take 20 mg by mouth 2 (two) times daily.    Historical Provider, MD  isosorbide mononitrate (IMDUR) 30 MG 24 hr tablet Take 30 mg by mouth daily.    Historical Provider, MD  metFORMIN (GLUCOPHAGE) 500 MG tablet Take 500 mg by mouth 2 (two) times daily with a meal.    Historical Provider, MD  niacin 100 MG tablet Take 100 mg by mouth daily with breakfast.    Historical Provider, MD  nitrofurantoin, macrocrystal-monohydrate, (MACROBID) 100 MG capsule Take 1 capsule (100 mg total) by mouth 2 (two) times daily. Take with food. 11/07/15   Lattie HawStephen A Evangelene Vora, MD  omeprazole (PRILOSEC) 40  MG capsule Take 40 mg by mouth daily.    Historical Provider, MD  pravastatin (PRAVACHOL) 20 MG tablet Take 20 mg by mouth daily.    Historical Provider, MD   Meds Ordered and Administered this Visit  Medications - No data to display  BP 97/63 mmHg  Pulse 76  Temp(Src) 98.1 F (36.7 C) (Oral)  Resp 16  Ht  (1.676 m)  Wt 169 lb (76.658 kg)  BMI 27.29 kg/m2  SpO2 96% No data found.   Physical Exam Nursing notes and Vital Signs reviewed. Appearance:  Patient appears stated age, and in no acute distress.    Eyes:  Pupils are equal, round, and reactive to light and accomodation.  Extraocular movement is intact.  Conjunctivae are not inflamed   Pharynx:  Normal; moist mucous  membranes  Neck:  Supple.  No adenopathy Lungs:  Clear to auscultation.  Breath sounds are equal.  Moving air well. Heart:  Regular rate and rhythm without murmurs, rubs, or gallops.  Abdomen:  Nontender without masses or hepatosplenomegaly.  Bowel sounds are present.  No CVA or flank tenderness.  Extremities:  No edema.  Skin:  No rash present.    ED Course  Procedures      Labs Reviewed  URINE CULTURE    MDM   1. Cystitis    Urine culture pending.  May use non-prescription AZO for about two days, if desired, to decrease urinary discomfort.  Begin Macrobid  BID for one week. Increase fluid intake. If symptoms become significantly worse during the night or over the weekend, proceed to the local emergency room.  Followup with Family Doctor if not improved in one week.     Lattie Haw, MD 11/10/15 [phone]

## 2015-11-07 NOTE — ED Notes (Signed)
Reports onset of dysuria and frequency over past 24 hours; has taken AZO for comfort.

## 2015-11-08 LAB — URINE CULTURE

## 2015-11-09 ENCOUNTER — Telehealth: Payer: Self-pay | Admitting: Emergency Medicine

## 2016-10-11 ENCOUNTER — Emergency Department (INDEPENDENT_AMBULATORY_CARE_PROVIDER_SITE_OTHER): Payer: Medicare HMO

## 2016-10-11 ENCOUNTER — Encounter: Payer: Self-pay | Admitting: *Deleted

## 2016-10-11 ENCOUNTER — Emergency Department
Admission: EM | Admit: 2016-10-11 | Discharge: 2016-10-11 | Disposition: A | Discharge status: Home / Self Care | Payer: Medicare HMO | Source: Home / Self Care | Attending: Family Medicine | Admitting: Family Medicine

## 2016-10-11 DIAGNOSIS — R0602 Shortness of breath: Secondary | ICD-10-CM

## 2016-10-11 DIAGNOSIS — J209 Acute bronchitis, unspecified: Secondary | ICD-10-CM | POA: Diagnosis not present

## 2016-10-11 DIAGNOSIS — R05 Cough: Secondary | ICD-10-CM | POA: Diagnosis not present

## 2016-10-11 MED ORDER — PREDNISONE 20 MG PO TABS: ORAL_TABLET | ORAL | 0 refills | Status: AC

## 2016-10-11 MED ORDER — ALBUTEROL SULFATE HFA 108 (90 BASE) MCG/ACT IN AERS: 1.0000 | INHALATION_SPRAY | Freq: Four times a day (QID) | RESPIRATORY_TRACT | 0 refills | Status: AC | PRN

## 2016-10-11 MED ORDER — AEROCHAMBER PLUS W/MASK MISC: 2 refills | Status: AC

## 2016-10-11 MED ORDER — AZITHROMYCIN 250 MG PO TABS: 250.0000 mg | ORAL_TABLET | Freq: Every day | ORAL | 0 refills | Status: AC

## 2016-10-11 NOTE — ED Triage Notes (Signed)
Pt c/o productive cough and SOB x 3 days. Denies fever. Taking mucinex.

## 2016-10-11 NOTE — ED Provider Notes (Signed)
CSN: [id number]     Arrival date & time 10/11/16  1211 History   First MD Initiated Contact with Patient 10/11/16 1231     Chief Complaint  Patient presents with  . Cough   (Consider location/radiation/quality/duration/timing/severity/associated sxs/prior Treatment) HPI  April Delgado is a 71 y.o. female presenting to UC with c/o 3 days of gradually worsening SOB with productive cough with white sputum, preceded by 3-4 weeks of intermittent cough.  Denies fever, chills, n/v/d. Hx of CHF but denies leg swelling. Denies chest pain.  Pt believes it is due to a viral or bacterial illness.  She has taken Mucinex with mild relief.  Denies known sick contacts.    Past Medical History:  Diagnosis Date  . CHF (congestive heart failure) (HCC)   . Chronic kidney disease    stage 2  . Diabetes mellitus without complication (HCC)   . Hyperlipidemia   . Hypertension    Past Surgical History:  Procedure Laterality Date  . CORONARY STENT PLACEMENT    . OVARIAN CYST REMOVAL     Family History  Problem Relation Age of Onset  . Diabetes Sister   . Hyperlipidemia Sister   . Hypertension Sister   . COPD Brother    Social History  Substance Use Topics  . Smoking status: Never Smoker  . Smokeless tobacco: Never Used  . Alcohol use No   OB History    No data available     Review of Systems  Constitutional: Negative for chills and fever.  HENT: Positive for congestion. Negative for ear pain, sore throat, trouble swallowing and voice change.   Respiratory: Positive for cough and shortness of breath.   Cardiovascular: Negative for chest pain, palpitations and leg swelling.  Gastrointestinal: Negative for abdominal pain, diarrhea, nausea and vomiting.  Musculoskeletal: Negative for arthralgias, back pain and myalgias.  Skin: Negative for rash.    Allergies  Latex and Sulfur  Home Medications   Prior to Admission medications   Medication Sig Start Date End Date Taking? Authorizing  Provider  albuterol (PROVENTIL HFA;VENTOLIN HFA) 108 (90 Base) MCG/ACT inhaler Inhale 1-2 puffs into the lungs every 6 (six) hours as needed for wheezing or shortness of breath. 10/11/16   Junius Finner, PA-C  aspirin 81 MG tablet Take 81 mg by mouth daily.    Historical Provider, MD  azithromycin (ZITHROMAX) 250 MG tablet Take 1 tablet (250 mg total) by mouth daily. Take first 2 tablets together, then 1 every day until finished. 10/11/16   Junius Finner, PA-C  carvedilol (COREG) 25 MG tablet Take 25 mg by mouth 2 (two) times daily with a meal.    Historical Provider, MD  FERROUS SULFATE DRIED PO Take by mouth.    Historical Provider, MD  furosemide (LASIX) 20 MG tablet Take 20 mg by mouth 2 (two) times daily.    Historical Provider, MD  isosorbide mononitrate (IMDUR) 30 MG 24 hr tablet Take 30 mg by mouth daily.    Historical Provider, MD  metFORMIN (GLUCOPHAGE) 500 MG tablet Take 500 mg by mouth 2 (two) times daily with a meal.    Historical Provider, MD  niacin 100 MG tablet Take 100 mg by mouth daily with breakfast.    Historical Provider, MD  omeprazole (PRILOSEC) 40 MG capsule Take 40 mg by mouth daily.    Historical Provider, MD  pravastatin (PRAVACHOL) 20 MG tablet Take 20 mg by mouth daily.    Historical Provider, MD  predniSONE (DELTASONE) 20 MG tablet 3  tabs po day one, then 2 po daily x 4 days 10/11/16   Junius FinnerErin O'Malley, PA-C  Spacer/Aero-Holding Chambers (AEROCHAMBER PLUS WITH MASK) inhaler Use as instructed 10/11/16   Junius FinnerErin O'Malley, PA-C  spironolactone (ALDACTONE) 25 MG tablet Take 25 mg by mouth daily.    Historical Provider, MD   Meds Ordered and Administered this Visit  Medications - No data to display  BP 133/56 (BP Location: Left Arm)   Pulse 89   Temp 97.8 F (36.6 C) (Oral)   Resp 24   Ht 5\' 6"  (1.676 m)   Wt 167 lb (75.8 kg)   SpO2 92%   BMI 26.95 kg/m  No data found.   Physical Exam  Constitutional: She is oriented to person, place, and time. She appears well-developed  and well-nourished. No distress.  HENT:  Head: Normocephalic and atraumatic.  Eyes: EOM are normal.  Neck: Normal range of motion. Neck supple.  Cardiovascular: Normal rate and regular rhythm.   Pulmonary/Chest: Effort normal and breath sounds normal. No stridor. No respiratory distress. She has no wheezes.  Diffuse coarse breath sounds, intermittent productive cough.  Mildly winded between sentences, no accessory muscle use.   Musculoskeletal: Normal range of motion. She exhibits no edema.  Lymphadenopathy:    She has no cervical adenopathy.  Neurological: She is alert and oriented to person, place, and time.  Skin: Skin is warm and dry. She is not diaphoretic.  Psychiatric: She has a normal mood and affect. Her behavior is normal.  Nursing note and vitals reviewed.   Urgent Care Course     Procedures (including critical care time)  Labs Review Labs Reviewed - No data to display  Imaging Review Dg Chest 2 View  Result Date: 10/11/2016 CLINICAL DATA:  Cough, shortness of breath for 3 days EXAM: CHEST  2 VIEW COMPARISON:  11/25/2014 FINDINGS: The lungs are hyperinflated likely secondary to COPD. There is no focal parenchymal opacity. There is no pleural effusion or pneumothorax. The heart and mediastinal contours are unremarkable. Bilateral breast dystrophic calcifications from prior surgeries. The osseous structures are unremarkable. IMPRESSION: No active cardiopulmonary disease. Electronically Signed   By: Elige KoHetal  Patel   On: 10/11/2016 12:55      MDM   1. Acute bronchitis, unspecified organism    Pt c/o worsening cough for 3 days, preceded by 3-4 weeks of productive intermittent cough.  CXR: no active cardiopulmonary disease.  Due to duration of reported cough, will cover for atypical bacteria.  Rx: Azithromycin, prednisone, albuterol inhaler with spacer  F/u with PCP in 1 week if not improving.    Junius Finnerrin O'Malley, PA-C 10/11/16 1420

## 2016-10-13 ENCOUNTER — Telehealth: Payer: Self-pay | Admitting: Emergency Medicine

## 2016-10-13 NOTE — Telephone Encounter (Signed)
Still has cough, but feeling better

## 2018-04-21 IMAGING — DX DG CHEST 2V
2 series · 2 of 2 positions shown · non-contrast
Comparison: 11/25/2014

CLINICAL DATA: Cough, shortness of breath for 3 days

EXAM:
CHEST  2 VIEW

[chest pa]
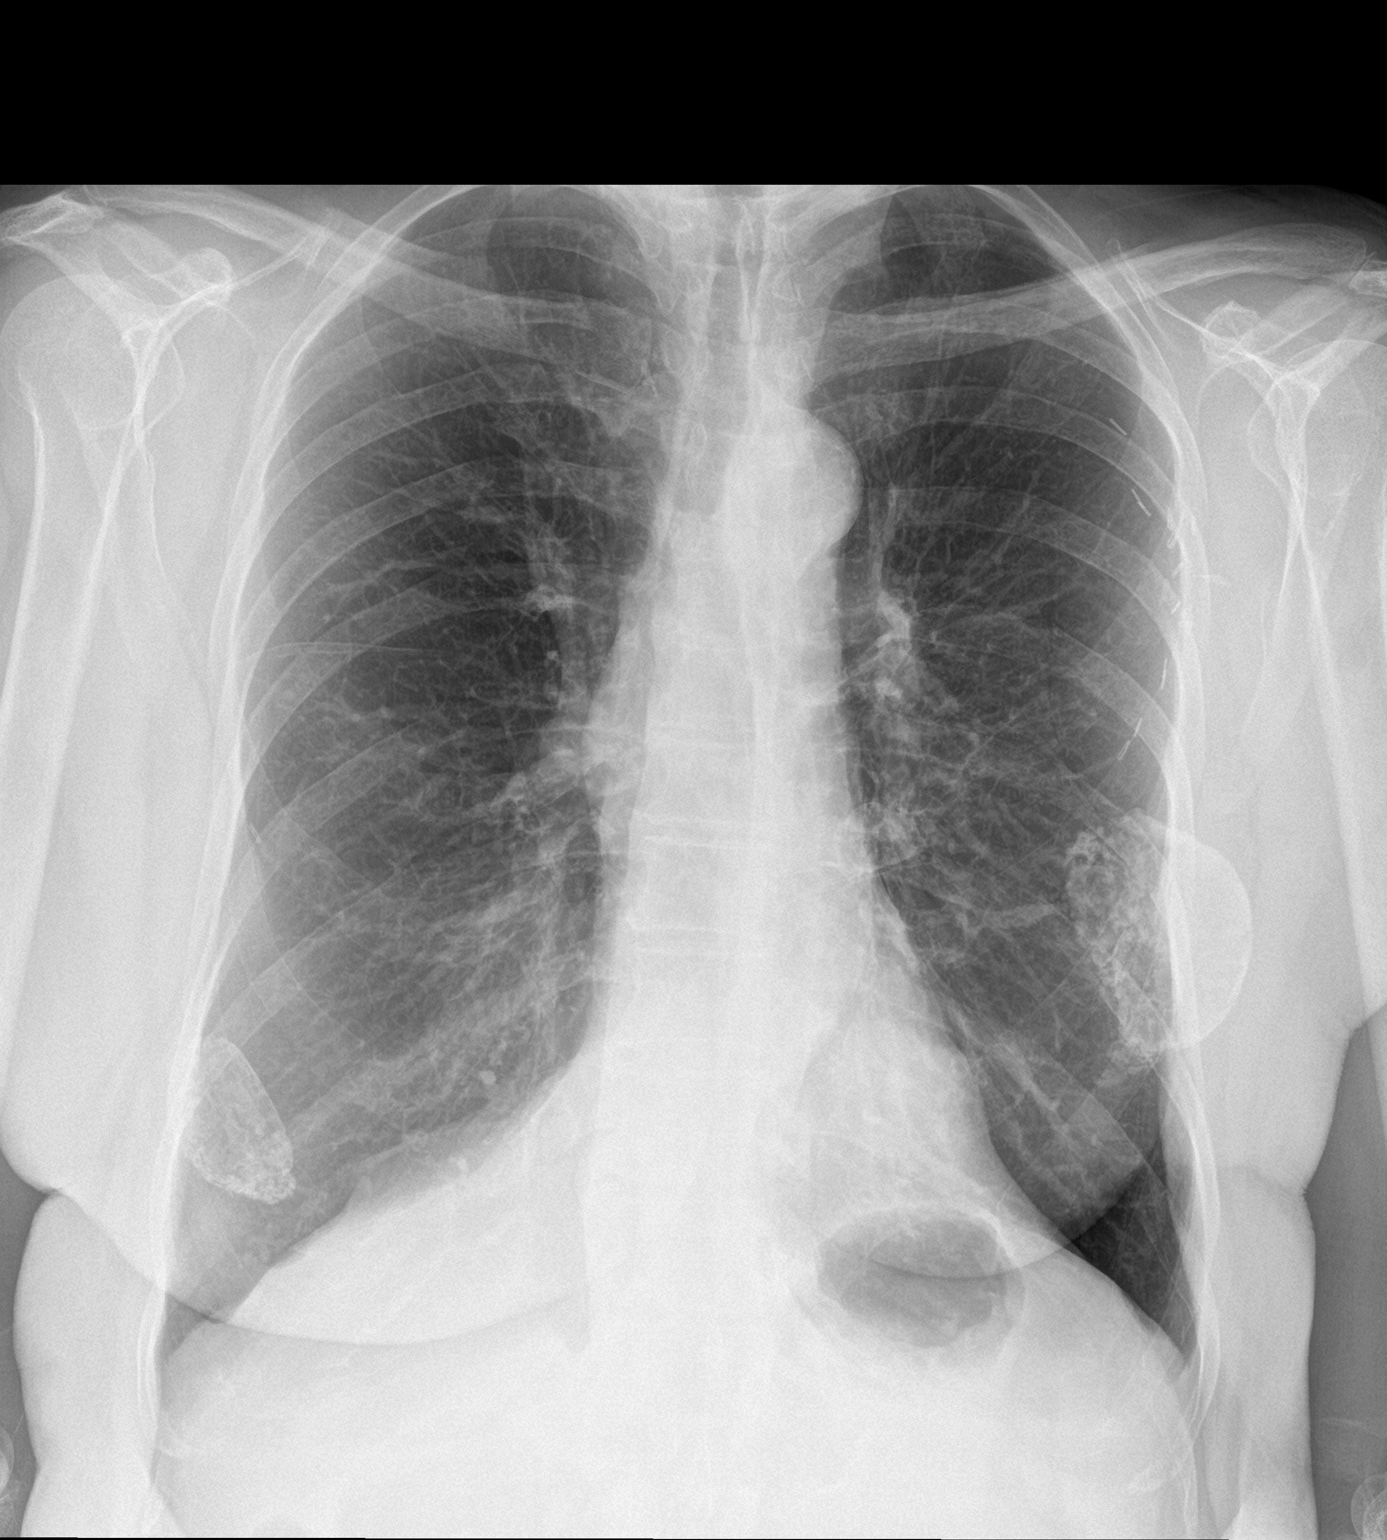

[chest lat]
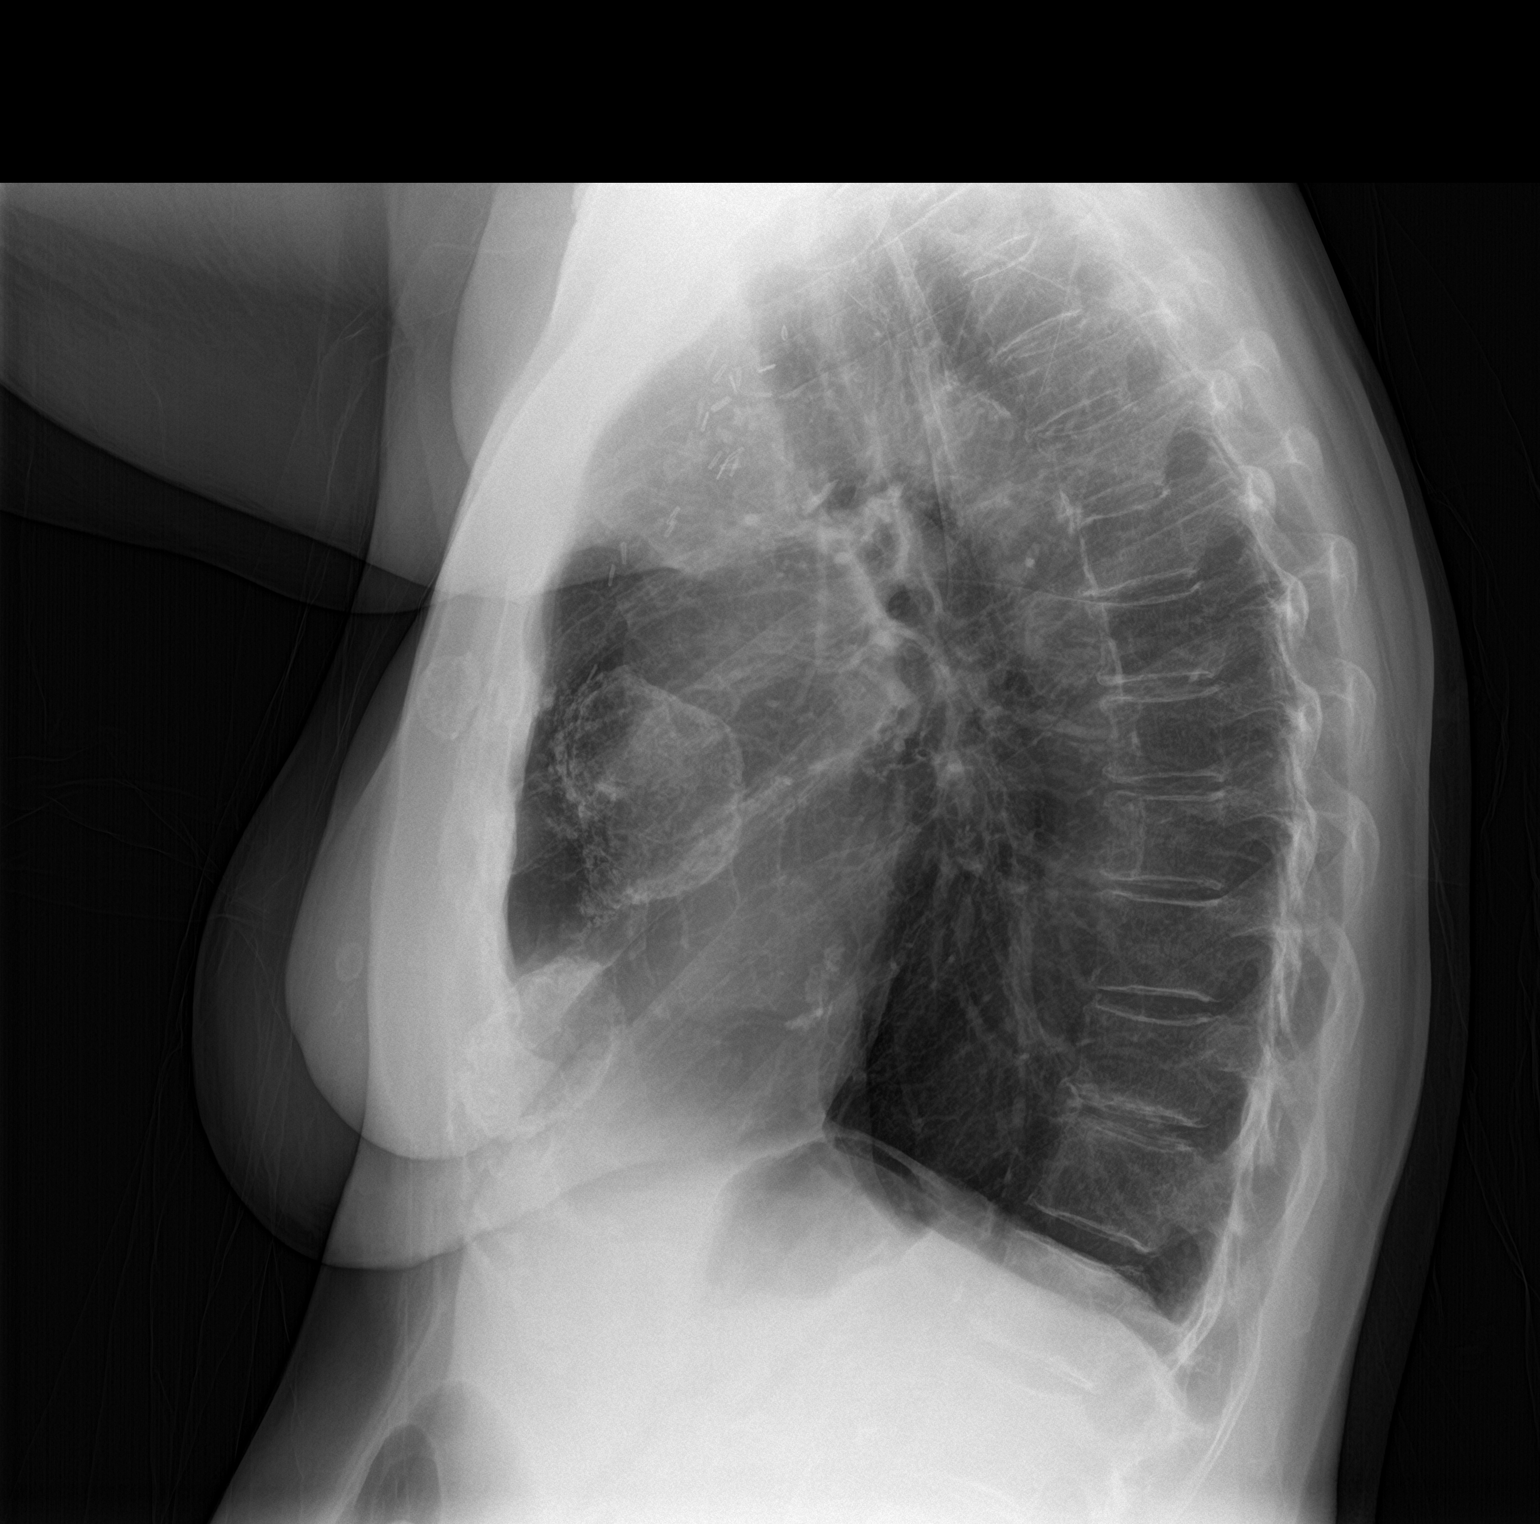

[2 of 2 positions shown; findings below may reference images not displayed]

FINDINGS: The lungs are hyperinflated likely secondary to COPD. There is no
focal parenchymal opacity. There is no pleural effusion or
pneumothorax. The heart and mediastinal contours are unremarkable.

Bilateral breast dystrophic calcifications from prior surgeries.

The osseous structures are unremarkable.
IMPRESSION: No active cardiopulmonary disease.
